# Patient Record
Sex: Female | Born: 1995 | Race: White | Hispanic: No | Marital: Single | Smoking: Never smoker
Health system: Southern US, Community
[De-identification: ages and names within clinical notes are randomized; demographics above are authoritative.]

## PROBLEM LIST (undated history)

## (undated) HISTORY — PX: TONSILLECTOMY: SUR1361

---
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
## 2018-11-05 DIAGNOSIS — L700 Acne vulgaris: Secondary | ICD-10-CM | POA: Diagnosis not present

## 2018-11-05 DIAGNOSIS — Z3202 Encounter for pregnancy test, result negative: Secondary | ICD-10-CM | POA: Diagnosis not present

## 2018-12-10 DIAGNOSIS — L700 Acne vulgaris: Secondary | ICD-10-CM | POA: Diagnosis not present

## 2018-12-10 DIAGNOSIS — Z3202 Encounter for pregnancy test, result negative: Secondary | ICD-10-CM | POA: Diagnosis not present

## 2018-12-23 DIAGNOSIS — Z0184 Encounter for antibody response examination: Secondary | ICD-10-CM | POA: Diagnosis not present

## 2018-12-25 DIAGNOSIS — B009 Herpesviral infection, unspecified: Secondary | ICD-10-CM | POA: Diagnosis not present

## 2018-12-25 DIAGNOSIS — R69 Illness, unspecified: Secondary | ICD-10-CM | POA: Diagnosis not present

## 2019-03-24 DIAGNOSIS — Z20828 Contact with and (suspected) exposure to other viral communicable diseases: Secondary | ICD-10-CM | POA: Diagnosis not present

## 2019-04-02 DIAGNOSIS — Z20828 Contact with and (suspected) exposure to other viral communicable diseases: Secondary | ICD-10-CM | POA: Diagnosis not present

## 2019-06-01 DIAGNOSIS — Z23 Encounter for immunization: Secondary | ICD-10-CM | POA: Diagnosis not present

## 2019-06-01 DIAGNOSIS — J029 Acute pharyngitis, unspecified: Secondary | ICD-10-CM | POA: Diagnosis not present

## 2019-06-01 DIAGNOSIS — Z0184 Encounter for antibody response examination: Secondary | ICD-10-CM | POA: Diagnosis not present

## 2019-07-19 DIAGNOSIS — Z20822 Contact with and (suspected) exposure to covid-19: Secondary | ICD-10-CM | POA: Diagnosis not present

## 2019-09-14 DIAGNOSIS — R05 Cough: Secondary | ICD-10-CM | POA: Diagnosis not present

## 2019-09-14 DIAGNOSIS — J0100 Acute maxillary sinusitis, unspecified: Secondary | ICD-10-CM | POA: Diagnosis not present

## 2019-12-14 DIAGNOSIS — Z23 Encounter for immunization: Secondary | ICD-10-CM | POA: Diagnosis not present

## 2020-01-12 DIAGNOSIS — Z23 Encounter for immunization: Secondary | ICD-10-CM | POA: Diagnosis not present

## 2020-02-25 DIAGNOSIS — Z30430 Encounter for insertion of intrauterine contraceptive device: Secondary | ICD-10-CM | POA: Diagnosis not present

## 2020-02-25 DIAGNOSIS — Z3202 Encounter for pregnancy test, result negative: Secondary | ICD-10-CM | POA: Diagnosis not present

## 2020-03-24 DIAGNOSIS — Z30431 Encounter for routine checking of intrauterine contraceptive device: Secondary | ICD-10-CM | POA: Diagnosis not present

## 2020-03-24 DIAGNOSIS — Z6832 Body mass index (BMI) 32.0-32.9, adult: Secondary | ICD-10-CM | POA: Diagnosis not present
